# Patient Record
Sex: Female | Born: 1990 | Race: White | Hispanic: No | Marital: Single | Smoking: Never smoker
Health system: Southern US, Community
[De-identification: ages and names within clinical notes are randomized; demographics above are authoritative.]

## PROBLEM LIST (undated history)

## (undated) HISTORY — PX: WISDOM TOOTH EXTRACTION: SHX21

---
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
## 2019-10-21 DIAGNOSIS — R0789 Other chest pain: Secondary | ICD-10-CM | POA: Diagnosis not present

## 2019-10-21 DIAGNOSIS — R05 Cough: Secondary | ICD-10-CM | POA: Diagnosis not present

## 2020-02-18 DIAGNOSIS — M79642 Pain in left hand: Secondary | ICD-10-CM | POA: Diagnosis not present

## 2020-02-18 DIAGNOSIS — M25532 Pain in left wrist: Secondary | ICD-10-CM | POA: Diagnosis not present

## 2020-06-13 DIAGNOSIS — J029 Acute pharyngitis, unspecified: Secondary | ICD-10-CM | POA: Diagnosis not present

## 2020-06-13 DIAGNOSIS — R0981 Nasal congestion: Secondary | ICD-10-CM | POA: Diagnosis not present

## 2020-06-13 DIAGNOSIS — R059 Cough, unspecified: Secondary | ICD-10-CM | POA: Diagnosis not present

## 2020-07-26 DIAGNOSIS — N898 Other specified noninflammatory disorders of vagina: Secondary | ICD-10-CM | POA: Diagnosis not present

## 2020-07-26 DIAGNOSIS — Z975 Presence of (intrauterine) contraceptive device: Secondary | ICD-10-CM | POA: Diagnosis not present

## 2020-07-26 DIAGNOSIS — Z30431 Encounter for routine checking of intrauterine contraceptive device: Secondary | ICD-10-CM | POA: Diagnosis not present

## 2020-07-26 DIAGNOSIS — Z803 Family history of malignant neoplasm of breast: Secondary | ICD-10-CM | POA: Diagnosis not present

## 2020-09-13 DIAGNOSIS — B351 Tinea unguium: Secondary | ICD-10-CM | POA: Diagnosis not present

## 2020-09-13 DIAGNOSIS — Z5181 Encounter for therapeutic drug level monitoring: Secondary | ICD-10-CM | POA: Diagnosis not present

## 2020-10-11 DIAGNOSIS — Z1231 Encounter for screening mammogram for malignant neoplasm of breast: Secondary | ICD-10-CM | POA: Diagnosis not present

## 2020-10-11 DIAGNOSIS — Z803 Family history of malignant neoplasm of breast: Secondary | ICD-10-CM | POA: Diagnosis not present

## 2020-12-08 DIAGNOSIS — Z30432 Encounter for removal of intrauterine contraceptive device: Secondary | ICD-10-CM | POA: Diagnosis not present

## 2020-12-08 DIAGNOSIS — Z30431 Encounter for routine checking of intrauterine contraceptive device: Secondary | ICD-10-CM | POA: Diagnosis not present

## 2021-01-04 DIAGNOSIS — R051 Acute cough: Secondary | ICD-10-CM | POA: Diagnosis not present

## 2021-01-04 DIAGNOSIS — J029 Acute pharyngitis, unspecified: Secondary | ICD-10-CM | POA: Diagnosis not present

## 2021-08-04 ENCOUNTER — Ambulatory Visit (INDEPENDENT_AMBULATORY_CARE_PROVIDER_SITE_OTHER): Payer: Federal, State, Local not specified - PPO | Admitting: Obstetrics and Gynecology

## 2021-08-04 ENCOUNTER — Encounter: Payer: Self-pay | Admitting: Obstetrics and Gynecology

## 2021-08-04 ENCOUNTER — Other Ambulatory Visit (HOSPITAL_COMMUNITY)
Admission: RE | Admit: 2021-08-04 | Discharge: 2021-08-04 | Disposition: A | Discharge status: Home / Self Care | Payer: Federal, State, Local not specified - PPO | Source: Ambulatory Visit | Attending: Obstetrics and Gynecology | Admitting: Obstetrics and Gynecology

## 2021-08-04 VITALS — BP 121/74 | HR 63 | Ht 63.0 in | Wt 134.0 lb

## 2021-08-04 DIAGNOSIS — N898 Other specified noninflammatory disorders of vagina: Secondary | ICD-10-CM | POA: Insufficient documentation

## 2021-08-04 DIAGNOSIS — N939 Abnormal uterine and vaginal bleeding, unspecified: Secondary | ICD-10-CM | POA: Diagnosis not present

## 2021-08-04 DIAGNOSIS — Z8049 Family history of malignant neoplasm of other genital organs: Secondary | ICD-10-CM | POA: Diagnosis not present

## 2021-08-04 MED ORDER — NORGESTIMATE-ETH ESTRADIOL 0.25-35 MG-MCG PO TABS: 1.0000 | ORAL_TABLET | Freq: Every day | ORAL | 0 refills | Status: DC

## 2021-08-04 NOTE — Progress Notes (Signed)
GYNECOLOGY ENCOUNTER NOTE  History:     Beth Townsend is a 31 y.o. [phone] female here for concerns about abnormal bleeding/discharge. In   Oct 2022 she had her mirena IUD removed because she started noticing an increase in brown vaginal discharge. The IUD was in for 3 years. She originally had the IUD placed for abnormal mentrual cycles.  She reports since the IUD was removed the abnormal spotting has become more frequent. She does have a menstrual cycle every month. Her discharge waxes and wanes from sticky to white, to clear. 1 week prior to her menstrual cycle she has brown to dark brown vaginal discharge, then her cycle starts where she has heavy bleeding with abdominal cramping.  At times she has brown discharge and bleeding totally 2 weeks.  Family history includes mother with history of cervical CA.   Last Pap was Oct 2022 she thinks it was normal; although not certain. She currently is not on any BC. She is in a same sex relationship, partner is present today with  her.   Gynecologic History Patient's last menstrual period was 07/30/2021.   Obstetric History OB History  Gravida Para Term Preterm AB Living  2 2 2     2   SAB IAB Ectopic Multiple Live Births               # Outcome Date GA Lbr Len/2nd Weight Sex Delivery Anes PTL Lv  2 Term      Vag-Spont     1 Term      Vag-Spont       History reviewed. No pertinent past medical history.  Past Surgical History:  Procedure Laterality Date   WISDOM TOOTH EXTRACTION      No current outpatient medications on file prior to visit.   No current facility-administered medications on file prior to visit.    Not on File  Social History:  reports that she has never smoked. She has never used smokeless tobacco. She reports current alcohol use. She reports that she does not use drugs.  Family History  Problem Relation Age of Onset   Stroke Paternal Grandmother    Diabetes Paternal Grandmother    Cancer Mother    Breast  cancer Mother    Cervical cancer Mother     The following portions of the patient's history were reviewed and updated as appropriate: allergies, current medications, past family history, past medical history, past social history, past surgical history and problem list.  Review of Systems Pertinent items noted in HPI and remainder of comprehensive ROS otherwise negative.  Physical Exam:  BP 121/74   Pulse 63   Ht 5' 3"  (1.6 m)   Wt 134 lb (60.8 kg)   LMP 07/30/2021   BMI 23.74 kg/m  CONSTITUTIONAL: Well-developed, well-nourished female in no acute distress.  HENT:  Normocephalic NEUROLOGIC: Alert and oriented to person, place, and time.  PSYCHIATRIC: Normal mood and affect.  ABDOMEN: Soft, no distention noted.  No tenderness, rebound or guarding.  PELVIC: Normal appearing external genitalia and urethral meatus; normal appearing vaginal mucosa and cervix.  Small amount of pink vaginal discharge at the cervix.   Pap smear obtained.  Normal uterine size, no other palpable masses, no uterine or adnexal tenderness.  Performed in the presence of a chaperone.  Assessment and Plan:   1. Family history of cervical cancer  - Cytology - PAP( [place]) - US PELVIC COMPLETE WITH TRANSVAGINAL; Future - Will inquire about BRCA testing.  2. Vaginal discharge  - Cervicovaginal ancillary only( [place]) - US PELVIC COMPLETE WITH TRANSVAGINAL; Future  3. Abnormal bleeding in menstrual cycle  Will trial 2 months of BC pills. She is leaving for out of town for [employer] and will follow up when she returns.    Ovila Lepage, Artist Pais, [place] for [employer], [place]

## 2021-08-08 ENCOUNTER — Ambulatory Visit (INDEPENDENT_AMBULATORY_CARE_PROVIDER_SITE_OTHER): Payer: Federal, State, Local not specified - PPO

## 2021-08-08 DIAGNOSIS — N898 Other specified noninflammatory disorders of vagina: Secondary | ICD-10-CM

## 2021-08-08 DIAGNOSIS — Z8049 Family history of malignant neoplasm of other genital organs: Secondary | ICD-10-CM | POA: Diagnosis not present

## 2021-08-08 DIAGNOSIS — N926 Irregular menstruation, unspecified: Secondary | ICD-10-CM | POA: Diagnosis not present

## 2021-08-09 LAB — CERVICOVAGINAL ANCILLARY ONLY
Bacterial Vaginitis (gardnerella): NEGATIVE
Candida Glabrata: NEGATIVE
Candida Vaginitis: NEGATIVE
Chlamydia: NEGATIVE
Comment: NEGATIVE
Comment: NEGATIVE
Comment: NEGATIVE
Comment: NEGATIVE
Comment: NEGATIVE
Comment: NORMAL
Neisseria Gonorrhea: NEGATIVE
Trichomonas: NEGATIVE

## 2021-08-10 LAB — CYTOLOGY - PAP
Diagnosis: NEGATIVE
Diagnosis: REACTIVE

## 2021-09-26 ENCOUNTER — Other Ambulatory Visit: Payer: Self-pay | Admitting: *Deleted

## 2021-09-26 MED ORDER — NORGESTIMATE-ETH ESTRADIOL 0.25-35 MG-MCG PO TABS: 1.0000 | ORAL_TABLET | Freq: Every day | ORAL | 11 refills | Status: AC

## 2021-10-26 DIAGNOSIS — R0602 Shortness of breath: Secondary | ICD-10-CM | POA: Diagnosis not present

## 2021-10-26 DIAGNOSIS — N938 Other specified abnormal uterine and vaginal bleeding: Secondary | ICD-10-CM | POA: Diagnosis not present

## 2021-10-26 DIAGNOSIS — R5383 Other fatigue: Secondary | ICD-10-CM | POA: Diagnosis not present

## 2021-10-26 DIAGNOSIS — N939 Abnormal uterine and vaginal bleeding, unspecified: Secondary | ICD-10-CM | POA: Diagnosis not present

## 2021-11-07 ENCOUNTER — Telehealth: Payer: Self-pay | Admitting: *Deleted

## 2021-11-07 NOTE — Telephone Encounter (Signed)
Left patient a message to call and schedule F/U with Beth Townsend.

## 2021-12-21 DIAGNOSIS — M62838 Other muscle spasm: Secondary | ICD-10-CM | POA: Diagnosis not present

## 2021-12-21 DIAGNOSIS — G8929 Other chronic pain: Secondary | ICD-10-CM | POA: Diagnosis not present

## 2021-12-21 DIAGNOSIS — M25511 Pain in right shoulder: Secondary | ICD-10-CM | POA: Diagnosis not present

## 2021-12-21 DIAGNOSIS — M7541 Impingement syndrome of right shoulder: Secondary | ICD-10-CM | POA: Diagnosis not present

## 2022-03-14 DIAGNOSIS — S46911A Strain of unspecified muscle, fascia and tendon at shoulder and upper arm level, right arm, initial encounter: Secondary | ICD-10-CM | POA: Diagnosis not present

## 2022-04-10 DIAGNOSIS — M5412 Radiculopathy, cervical region: Secondary | ICD-10-CM | POA: Diagnosis not present

## 2022-05-08 DIAGNOSIS — Z1330 Encounter for screening examination for mental health and behavioral disorders, unspecified: Secondary | ICD-10-CM | POA: Diagnosis not present

## 2022-05-08 DIAGNOSIS — M5412 Radiculopathy, cervical region: Secondary | ICD-10-CM | POA: Diagnosis not present

## 2022-05-18 DIAGNOSIS — M5412 Radiculopathy, cervical region: Secondary | ICD-10-CM | POA: Diagnosis not present

## 2022-05-25 DIAGNOSIS — M5412 Radiculopathy, cervical region: Secondary | ICD-10-CM | POA: Diagnosis not present

## 2022-05-31 DIAGNOSIS — M5412 Radiculopathy, cervical region: Secondary | ICD-10-CM | POA: Diagnosis not present

## 2022-06-13 DIAGNOSIS — M5412 Radiculopathy, cervical region: Secondary | ICD-10-CM | POA: Diagnosis not present

## 2022-06-20 DIAGNOSIS — M5412 Radiculopathy, cervical region: Secondary | ICD-10-CM | POA: Diagnosis not present

## 2022-07-02 DIAGNOSIS — M5412 Radiculopathy, cervical region: Secondary | ICD-10-CM | POA: Diagnosis not present

## 2022-09-10 DIAGNOSIS — U071 COVID-19: Secondary | ICD-10-CM | POA: Diagnosis not present

## 2022-12-20 IMAGING — US US PELVIS COMPLETE WITH TRANSVAGINAL
1 series · 14 of 25 positions shown · non-contrast
Comparison: None

CLINICAL DATA: Abnormal menstrual cycle, vaginal discharge
sometimes with blood, had Mirena IUD removed in December 2020, LMP
07/30/2021



[Series 1: us pelvic complete with transvaginal · 14 of 95 slices shown]
[im 1/95]
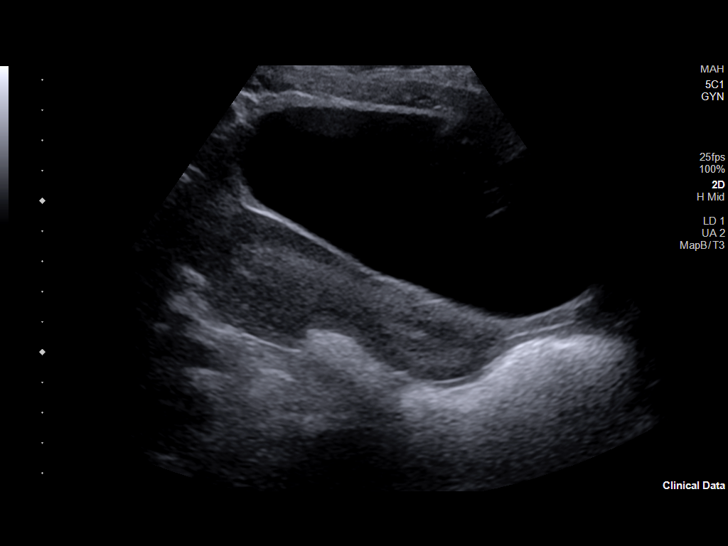
[im 8/95]
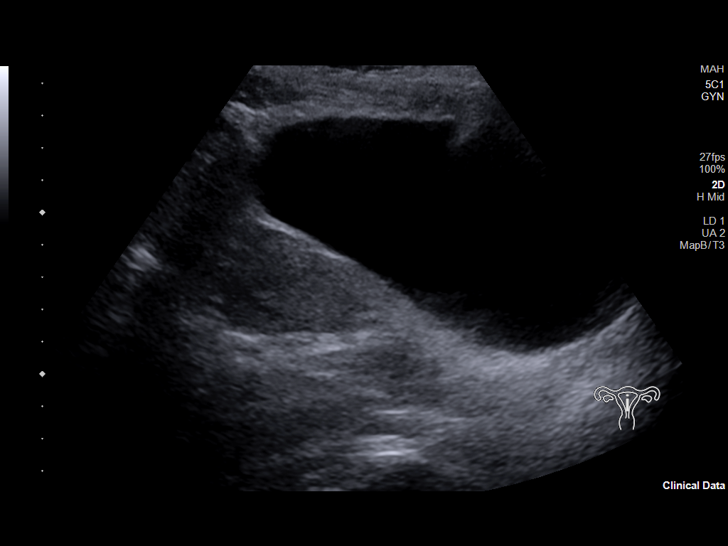
[im 16/95]
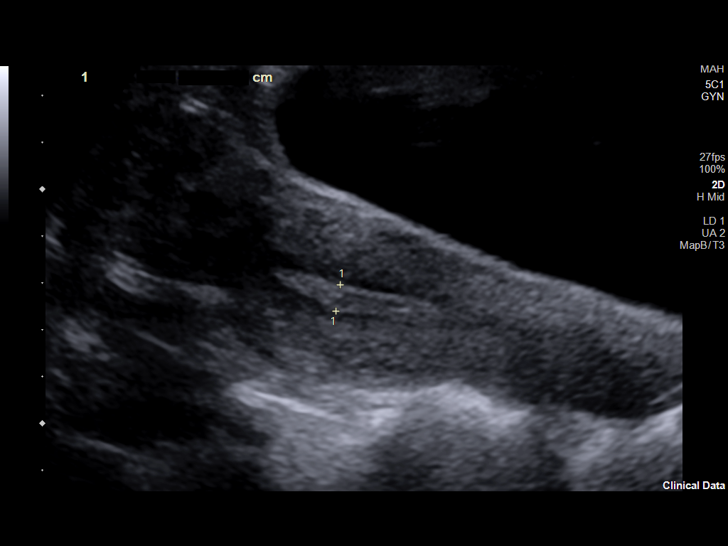
[im 24/95]
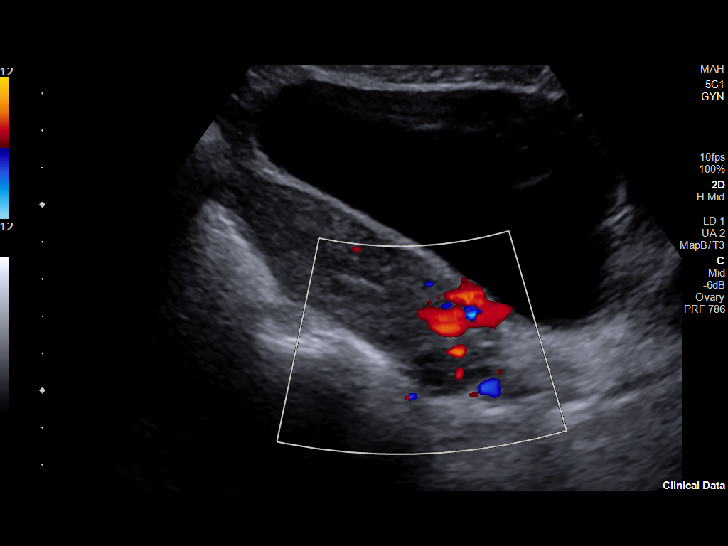
[im 32/95]
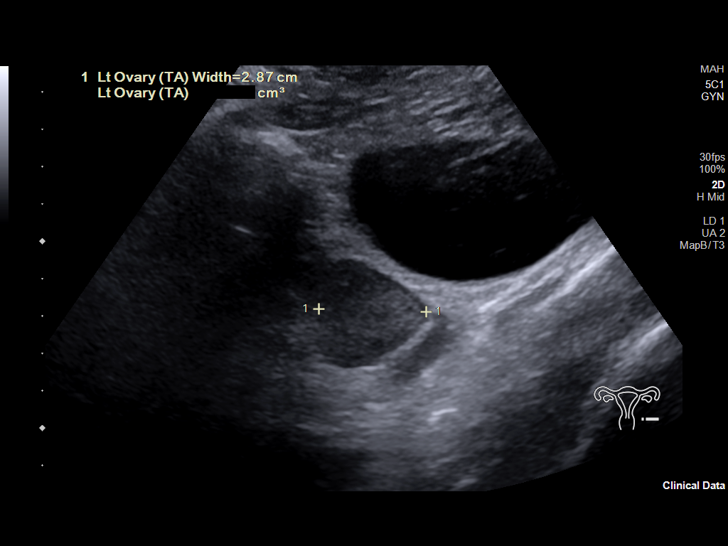
[im 36/95]
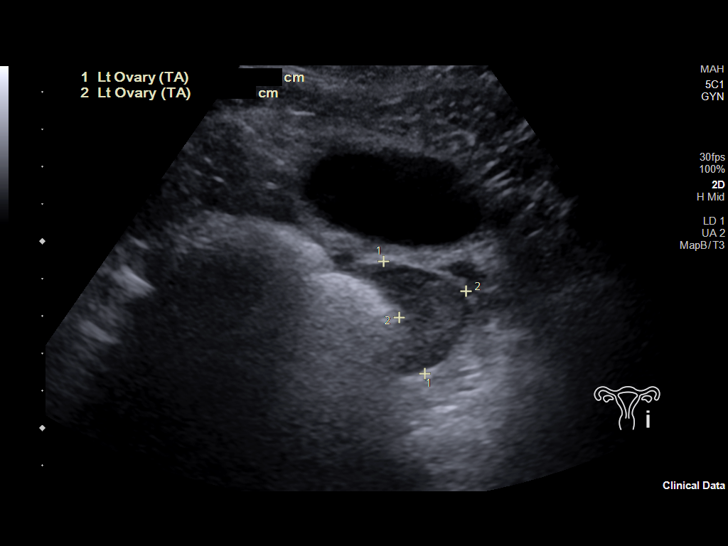
[im 44/95]
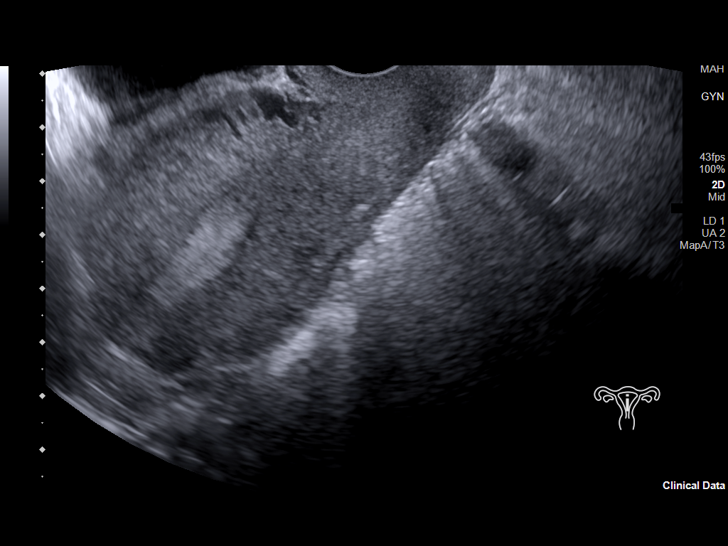
[im 51/95]
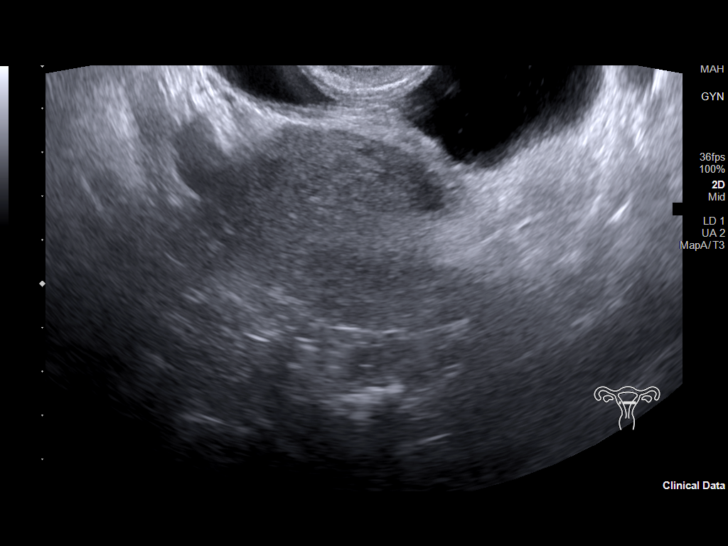
[im 59/95]
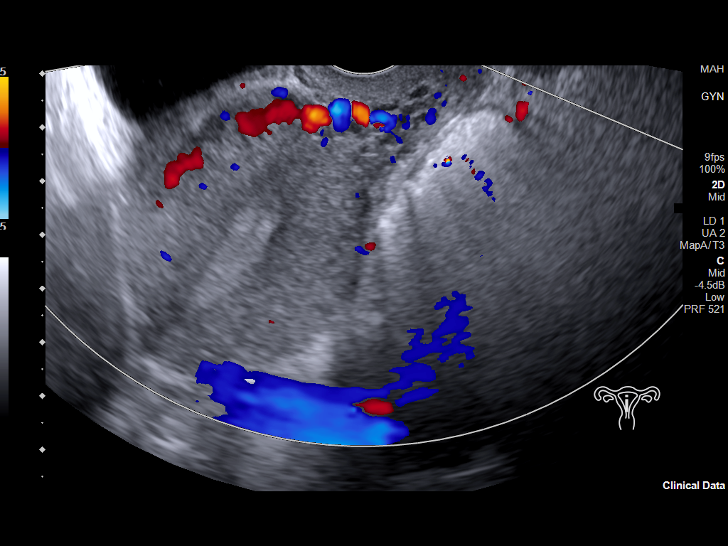
[im 63/95]
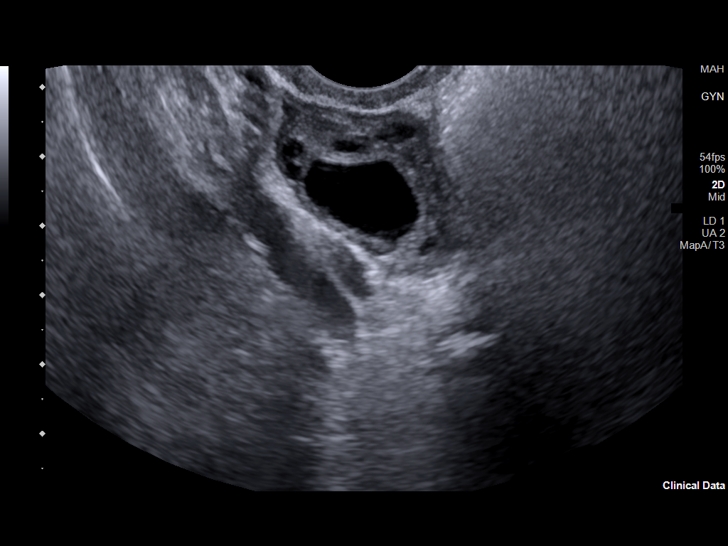
[im 71/95]
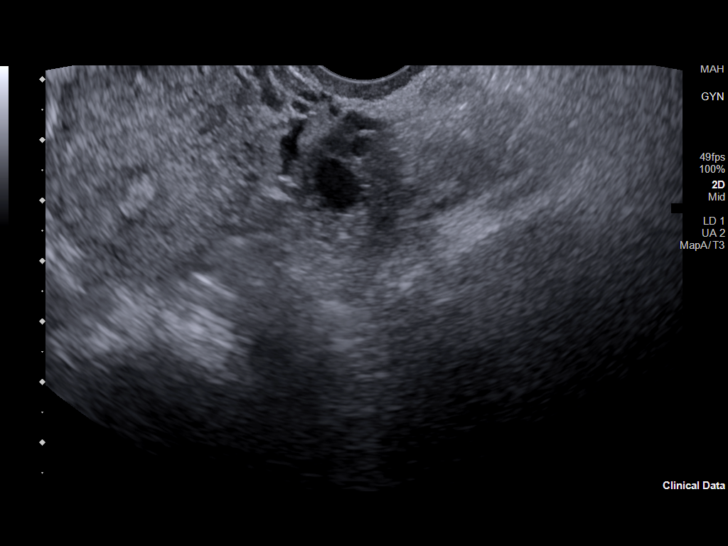
[im 79/95]
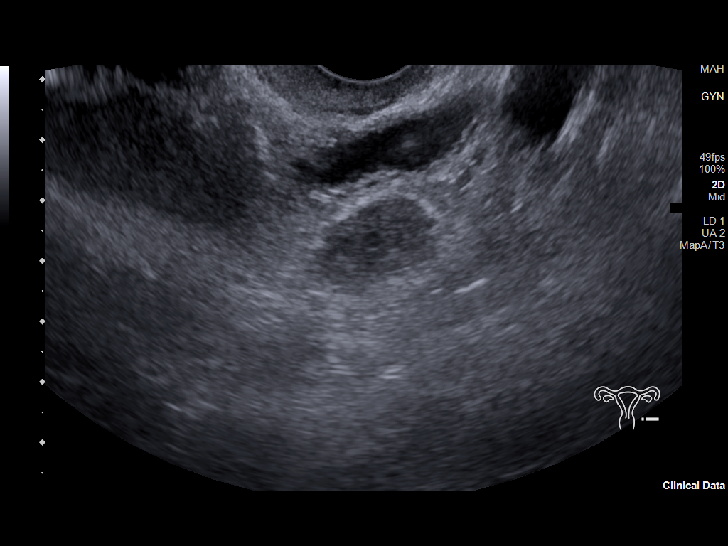
[im 87/95]
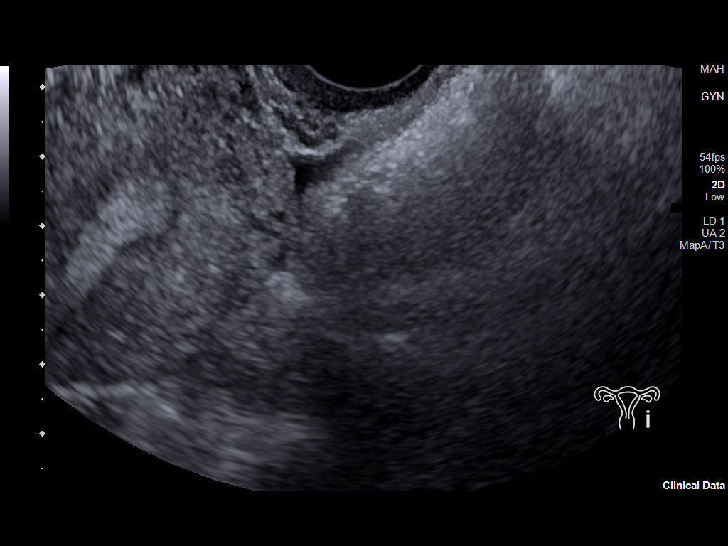
[im 95/95]
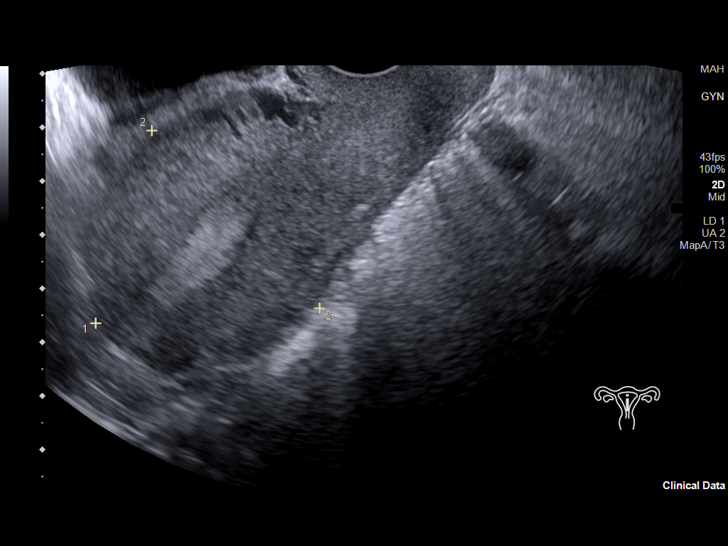

[14 of 25 positions shown; findings below may reference images not displayed]

FINDINGS: Uterus

Measurements: 10.8 x 3.8 x 4.4 cm = volume: 93 mL. Anteverted.
Normal morphology without mass

Endometrium

Thickness: 9 mm.  No endometrial fluid or mass

Right ovary

Measurements: 3.4 x 2.4 x 2.3 cm = volume: 9.5 mL. Normal morphology
without mass

Left ovary

Measurements: 3.5 x 1.6 x 1.9 cm = volume: 5.7 mL. Normal morphology
without mass

Other findings

Trace free pelvic fluid in RIGHT adnexa, potentially physiologic. No
adnexal masses.
IMPRESSION: No significant pelvic sonographic abnormalities.
